# Patient Record
Sex: Male | Born: 1985 | Race: White | Hispanic: No | Marital: Married | Smoking: Former smoker
Health system: Southern US, Community
[De-identification: ages and names within clinical notes are randomized; demographics above are authoritative.]

## PROBLEM LIST (undated history)

## (undated) DIAGNOSIS — F4310 Post-traumatic stress disorder, unspecified: Secondary | ICD-10-CM

## (undated) DIAGNOSIS — E079 Disorder of thyroid, unspecified: Secondary | ICD-10-CM

## (undated) DIAGNOSIS — K219 Gastro-esophageal reflux disease without esophagitis: Secondary | ICD-10-CM

---

## 2009-01-23 ENCOUNTER — Emergency Department (HOSPITAL_BASED_OUTPATIENT_CLINIC_OR_DEPARTMENT_OTHER): Admission: EM | Admit: 2009-01-23 | Discharge: 2009-01-23 | Payer: Self-pay | Admitting: Emergency Medicine

## 2009-03-03 ENCOUNTER — Emergency Department (HOSPITAL_BASED_OUTPATIENT_CLINIC_OR_DEPARTMENT_OTHER): Admission: EM | Admit: 2009-03-03 | Discharge: 2009-03-04 | Payer: Self-pay | Admitting: Emergency Medicine

## 2015-01-10 ENCOUNTER — Emergency Department (HOSPITAL_BASED_OUTPATIENT_CLINIC_OR_DEPARTMENT_OTHER)
Admission: EM | Admit: 2015-01-10 | Discharge: 2015-01-10 | Disposition: A | Discharge status: Home / Self Care | Payer: BLUE CROSS/BLUE SHIELD | Attending: Emergency Medicine | Admitting: Emergency Medicine

## 2015-01-10 ENCOUNTER — Encounter (HOSPITAL_BASED_OUTPATIENT_CLINIC_OR_DEPARTMENT_OTHER): Payer: Self-pay | Admitting: Emergency Medicine

## 2015-01-10 ENCOUNTER — Emergency Department (HOSPITAL_BASED_OUTPATIENT_CLINIC_OR_DEPARTMENT_OTHER): Payer: BLUE CROSS/BLUE SHIELD

## 2015-01-10 DIAGNOSIS — K219 Gastro-esophageal reflux disease without esophagitis: Secondary | ICD-10-CM | POA: Diagnosis not present

## 2015-01-10 DIAGNOSIS — Z87891 Personal history of nicotine dependence: Secondary | ICD-10-CM | POA: Diagnosis not present

## 2015-01-10 DIAGNOSIS — F4310 Post-traumatic stress disorder, unspecified: Secondary | ICD-10-CM | POA: Insufficient documentation

## 2015-01-10 DIAGNOSIS — R079 Chest pain, unspecified: Secondary | ICD-10-CM | POA: Diagnosis not present

## 2015-01-10 DIAGNOSIS — Z79899 Other long term (current) drug therapy: Secondary | ICD-10-CM | POA: Diagnosis not present

## 2015-01-10 DIAGNOSIS — R569 Unspecified convulsions: Secondary | ICD-10-CM | POA: Insufficient documentation

## 2015-01-10 HISTORY — DX: Gastro-esophageal reflux disease without esophagitis: K21.9

## 2015-01-10 HISTORY — DX: Post-traumatic stress disorder, unspecified: F43.10

## 2015-01-10 HISTORY — DX: Disorder of thyroid, unspecified: E07.9

## 2015-01-10 LAB — URINALYSIS, ROUTINE W REFLEX MICROSCOPIC
BILIRUBIN URINE: NEGATIVE
GLUCOSE, UA: NEGATIVE mg/dL
Hgb urine dipstick: NEGATIVE
KETONES UR: NEGATIVE mg/dL
Leukocytes, UA: NEGATIVE
Nitrite: NEGATIVE
PH: 5.5 (ref 5.0–8.0)
Protein, ur: NEGATIVE mg/dL
Specific Gravity, Urine: 1.022 (ref 1.005–1.030)
Urobilinogen, UA: 0.2 mg/dL (ref 0.0–1.0)

## 2015-01-10 LAB — BASIC METABOLIC PANEL
Anion gap: 7 (ref 5–15)
BUN: 22 mg/dL — AB (ref 6–20)
CALCIUM: 9.1 mg/dL (ref 8.9–10.3)
CO2: 24 mmol/L (ref 22–32)
CREATININE: 1 mg/dL (ref 0.61–1.24)
Chloride: 105 mmol/L (ref 101–111)
GFR calc Af Amer: >60 mL/min (ref >60)
Glucose, Bld: 133 mg/dL — ABNORMAL HIGH (ref 65–99)
Potassium: 3.7 mmol/L (ref 3.5–5.1)
SODIUM: 136 mmol/L (ref 135–145)

## 2015-01-10 LAB — CBG MONITORING, ED: GLUCOSE-CAPILLARY: 111 mg/dL — AB (ref 65–99)

## 2015-01-10 LAB — RAPID URINE DRUG SCREEN, HOSP PERFORMED
Amphetamines: NOT DETECTED
BARBITURATES: NOT DETECTED
BENZODIAZEPINES: NOT DETECTED
Cocaine: NOT DETECTED
Opiates: POSITIVE — AB
Tetrahydrocannabinol: NOT DETECTED

## 2015-01-10 LAB — I-STAT CG4 LACTIC ACID, ED: LACTIC ACID, VENOUS: 1.5 mmol/L (ref 0.5–2.0)

## 2015-01-10 LAB — ETHANOL

## 2015-01-10 NOTE — ED Provider Notes (Signed)
CSN: [id number]     Arrival date & time 01/10/15  1237 History   First MD Initiated Contact with Patient 01/10/15 1409     Chief Complaint  Patient presents with  . Seizures     (Consider location/radiation/quality/duration/timing/severity/associated sxs/prior Treatment) HPI   Blood pressure 128/67, pulse 70, temperature 97.8 F (36.6 C), temperature source Oral, resp. rate 17, height  (1.727 m), weight 207 lb (93.895 kg), SpO2 100 %.  Paul [place] is a 29 y.o. male with past medical history significant for PTSD and hypothyroid complaining of possible seizure-like activity last night at 3 AM. Patient states he got up from bed to help his pregnant wife up out of bed to use the bathroom. States that he felt very lightheaded and then there was a brief loss of consciousness for approximately 1 minute. As per wife patient slid back against the wall and down onto the floor, his upper extremities were contracted in he was making gurgling sounds with his mouth, his eyes were closed and he was nonresponsive. There was no incontinence. Patient has no prior history of seizure disorder. Patient was not postictal but states what happened when he woke up and immediately where he was to his wife was. He then proceeded to go directly to bed. She has been in his normal state of health, taking his medications as directed. States his last thyroid check was in March 2016. Patient reports a generalized frontal headache which is typical for his prior headaches, he has chronic left hip pain which is normal for him. Reports that he's been eating and drinking normally but he had 3 episodes of diarrhea yesterday. Patient denies melena, hematochezia, abdominal pain.  Past Medical History  Diagnosis Date  . PTSD (post-traumatic stress disorder)   . GERD (gastroesophageal reflux disease)   . Thyroid disease    History reviewed. No pertinent past surgical history. History reviewed. No pertinent family  history. Social History  Substance Use Topics  . Smoking status: Former Games developer  . Smokeless tobacco: None  . Alcohol Use: No    Review of Systems  10 systems reviewed and found to be negative, except as noted in the HPI.   Allergies  Review of patient's allergies indicates no known allergies.  Home Medications   Prior to Admission medications   Medication Sig Start Date End Date Taking? Authorizing Provider  acetaminophen-codeine (TYLENOL #2) 300-15 MG tablet Take 1 tablet by mouth every 4 (four) hours as needed for moderate pain.   Yes Historical Provider, MD  escitalopram (LEXAPRO) 10 MG tablet Take 10 mg by mouth daily.   Yes Historical Provider, MD  levothyroxine (SYNTHROID, LEVOTHROID) 50 MCG tablet Take 50 mcg by mouth daily before breakfast.   Yes Historical Provider, MD  meloxicam (MOBIC) 15 MG tablet Take 15 mg by mouth daily.   Yes Historical Provider, MD  methocarbamol (ROBAXIN) 750 MG tablet Take 750 mg by mouth 4 (four) times daily.   Yes Historical Provider, MD  pantoprazole (PROTONIX) 40 MG tablet Take 40 mg by mouth daily.   Yes Historical Provider, MD  prazosin (MINIPRESS) 2 MG capsule Take 2 mg by mouth at bedtime.   Yes Historical Provider, MD   BP 128/91 mmHg  Pulse 77  Temp(Src) 97.8 F (36.6 C) (Oral)  Resp 19  Ht  (1.727 m)  Wt 207 lb (93.895 kg)  BMI 31.48 kg/m2  SpO2 99% Physical Exam  Constitutional: He is oriented to person, place, and time. He appears well-developed  and well-nourished.  HENT:  Head: Normocephalic and atraumatic.  Mouth/Throat: Oropharynx is clear and moist.  No conjunctival pallor, mucous membranes are moist.  No intraoral or tongue trauma  Eyes: Conjunctivae and EOM are normal. Pupils are equal, round, and reactive to light.  No TTP of maxillary or frontal sinuses  No TTP or induration of temporal arteries bilaterally  Neck: Normal range of motion. Neck supple.  FROM to C-spine. Pt can touch chin to chest without  discomfort. No TTP of midline cervical spine.   Cardiovascular: Normal rate, regular rhythm and intact distal pulses.   Pulmonary/Chest: Effort normal and breath sounds normal. No respiratory distress. He has no wheezes. He has no rales. He exhibits no tenderness.  Abdominal: Soft. Bowel sounds are normal. There is no tenderness.  Musculoskeletal: Normal range of motion. He exhibits no edema or tenderness.  Neurological: He is alert and oriented to person, place, and time. No cranial nerve deficit.  II-Visual fields grossly intact. III/IV/VI-Extraocular movements intact.  Pupils reactive bilaterally. V/VII-Smile symmetric, equal eyebrow raise,  facial sensation intact VIII- Hearing grossly intact IX/X-Normal gag XI-bilateral shoulder shrug XII-midline tongue extension Motor: 5/5 bilaterally with normal tone and bulk Cerebellar: Normal finger-to-nose  and normal heel-to-shin test.   Romberg negative Ambulates with a coordinated gait   Nursing note and vitals reviewed.   ED Course  Procedures (including critical care time) Labs Review Labs Reviewed  BASIC METABOLIC PANEL - Abnormal; Notable for the following:    Glucose, Bld 133 (*)    BUN 22 (*)    All other components within normal limits  URINE RAPID DRUG SCREEN, HOSP PERFORMED - Abnormal; Notable for the following:    Opiates POSITIVE (*)    All other components within normal limits  CBG MONITORING, ED - Abnormal; Notable for the following:    Glucose-Capillary 111 (*)    All other components within normal limits  URINALYSIS, ROUTINE W REFLEX MICROSCOPIC (NOT AT [facility])  ETHANOL  I-STAT CG4 LACTIC ACID, ED    Imaging Review Ct Head Wo Contrast  01/10/2015  CLINICAL DATA:  Possible seizure last night. Found on the floor making abnormal sounds. EXAM: CT HEAD WITHOUT CONTRAST TECHNIQUE: Contiguous axial images were obtained from the base of the skull through the vertex without intravenous contrast. COMPARISON:  None.  FINDINGS: The brain has a normal appearance without evidence of malformation, atrophy, old or acute infarction, mass lesion, hemorrhage, hydrocephalus or extra-axial collection. The calvarium is unremarkable. The paranasal sinuses, middle ears and mastoids are clear. IMPRESSION: Normal head CT Electronically Signed   By: Paulina Fusi M.D.   On: 01/10/2015 17:45   I have personally reviewed and evaluated these images and lab results as part of my medical decision-making.   EKG Interpretation   Date/Time:  Friday January 10 2015 15:30:57 EST Ventricular Rate:  64 PR Interval:  164 QRS Duration: 90 QT Interval:  406 QTC Calculation: 418 R Axis:   43 Text Interpretation:  Normal sinus rhythm with sinus arrhythmia  Nonspecific T wave abnormality Abnormal ECG Confirmed by RAY MD, DANIELLE  ([id number]) on 01/10/2015 4:53:24 PM      MDM   Final diagnoses:  Seizure-like activity (HCC)    Filed Vitals:   01/10/15 1615 01/10/15 1630 01/10/15 1645 01/10/15 1700  BP: 111/71 125/87 130/82 128/91  Pulse: 70 65 69 77  Temp:      TempSrc:      Resp: Height:  Weight:      SpO2: 100% 99% 99% 99%     Paul Guerrero is 29 y.o. male presenting with seizure like activity last night at 3 AM. Patient had no postictal confusion, no incontinence. This is his first seizure. His neuro exam today is nonfocal. It is unclear if this was a true seizure versus syncope. He did have 3 episodes of non-melanotic, diarrhea yesterday. His abdominal exam is benign. Patient did feel dizzy before he went down yesterday. Blood work today is reassuring. Patient is given seizure precautions. He works as a Event organisercable installer. I've advised him that he will need to be on restrictions with no driving whatsoever, no climbing and he will need to work with a partner.  Blood work reassuring, his BUN slightly elevated consistent with dehydration. UDS is positive for opiates it appears that he is prescribed these for  his chronic left hip pain. EKG with no arrhythmia. Vital signs without orthostatic changes.  CT head negative.  Discussed case with attending physician who agrees with care plan and disposition.   Evaluation does not show pathology that would require ongoing emergent intervention or inpatient treatment. Pt is hemodynamically stable and mentating appropriately. Discussed findings and plan with patient/guardian, who agrees with care plan. All questions answered. Return precautions discussed and outpatient follow up given.       Wynetta Emeryicole Bharath Bernstein, PA-C 01/10/15 1759  Margarita Grizzleanielle Ray, MD 01/10/15 [phone]

## 2015-01-10 NOTE — ED Notes (Signed)
CBG is 111. 

## 2015-01-10 NOTE — ED Notes (Signed)
Pt in c/o possible seizure last night. Pt states he had gotten up and felt dizzy and felt like he fell asleep and was in a "dream state", wife found him on the floor making "snoring sounds", pt came to on the floor and did not remember the event but remembered feeling dizzy just prior to event. Pt with sister that has epilepsy. Pt is a+o and in NAD.

## 2015-01-10 NOTE — Discharge Instructions (Signed)
Do not drive, swim, or perform any activity that could be dangerous if you had a seizure while you are performing it. Do not take showers or baths without having someone close to you.   Follow closely with Neurology, [facility] should call you to set up an appointment shortly, if you do not hear from them in the next 3-4 days please call them  Please follow with your primary care doctor in the next 2 days for a check-up. They must obtain records for further management.   Do not hesitate to return to the Emergency Department for any new, worsening or concerning symptoms.    Seizure, Adult A seizure is abnormal electrical activity in the brain. Seizures usually last from 30 seconds to 2 minutes. There are various types of seizures. Before a seizure, you may have a warning sensation (aura) that a seizure is about to occur. An aura may include the following symptoms:   Fear or anxiety.  Nausea.  Feeling like the room is spinning (vertigo).  Vision changes, such as seeing flashing lights or spots. Common symptoms during a seizure include:  A change in attention or behavior (altered mental status).  Convulsions with rhythmic jerking movements.  Drooling.  Rapid eye movements.  Grunting.  Loss of bladder and bowel control.  Bitter taste in the mouth.  Tongue biting. After a seizure, you may feel confused and sleepy. You may also have an injury resulting from convulsions during the seizure. HOME CARE INSTRUCTIONS   If you are given medicines, take them exactly as prescribed by your health care provider.  Keep all follow-up appointments as directed by your health care provider.  Do not swim or drive or engage in risky activity during which a seizure could cause further injury to you or others until your health care provider says it is OK.  Get adequate rest.  Teach friends and family what to do if you have a seizure. They should:  Lay you on the ground to prevent a  fall.  Put a cushion under your head.  Loosen any tight clothing around your neck.  Turn you on your side. If vomiting occurs, this helps keep your airway clear.  Stay with you until you recover.  Know whether or not you need emergency care. SEEK IMMEDIATE MEDICAL CARE IF:  The seizure lasts longer than 5 minutes.  The seizure is severe or you do not wake up immediately after the seizure.  You have an altered mental status after the seizure.  You are having more frequent or worsening seizures. Someone should drive you to the emergency department or call local emergency services (911 in [place] MAKE SURE YOU:  Understand these instructions.  Will watch your condition.  Will get help right away if you are not doing well or get worse.   This information is not intended to replace advice given to you by your health care provider. Make sure you discuss any questions you have with your health care provider.   Document Released: 02/13/2000 Document Revised: 03/08/2014 Document Reviewed: 09/27/2012 Elsevier Interactive Patient Education [employer]2016 Elsevier Inc.

## 2016-08-17 ENCOUNTER — Encounter (HOSPITAL_BASED_OUTPATIENT_CLINIC_OR_DEPARTMENT_OTHER): Payer: Self-pay | Admitting: Emergency Medicine

## 2016-08-17 ENCOUNTER — Emergency Department (HOSPITAL_BASED_OUTPATIENT_CLINIC_OR_DEPARTMENT_OTHER)
Admission: EM | Admit: 2016-08-17 | Discharge: 2016-08-17 | Disposition: A | Discharge status: Home / Self Care | Payer: BLUE CROSS/BLUE SHIELD | Attending: Physician Assistant | Admitting: Physician Assistant

## 2016-08-17 DIAGNOSIS — Y92015 Private garage of single-family (private) house as the place of occurrence of the external cause: Secondary | ICD-10-CM | POA: Diagnosis not present

## 2016-08-17 DIAGNOSIS — Z87891 Personal history of nicotine dependence: Secondary | ICD-10-CM | POA: Insufficient documentation

## 2016-08-17 DIAGNOSIS — S60561A Insect bite (nonvenomous) of right hand, initial encounter: Secondary | ICD-10-CM | POA: Diagnosis present

## 2016-08-17 DIAGNOSIS — Z79899 Other long term (current) drug therapy: Secondary | ICD-10-CM | POA: Insufficient documentation

## 2016-08-17 DIAGNOSIS — X58XXXA Exposure to other specified factors, initial encounter: Secondary | ICD-10-CM | POA: Insufficient documentation

## 2016-08-17 DIAGNOSIS — Y999 Unspecified external cause status: Secondary | ICD-10-CM | POA: Insufficient documentation

## 2016-08-17 DIAGNOSIS — Y939 Activity, unspecified: Secondary | ICD-10-CM | POA: Diagnosis not present

## 2016-08-17 DIAGNOSIS — Z791 Long term (current) use of non-steroidal anti-inflammatories (NSAID): Secondary | ICD-10-CM | POA: Insufficient documentation

## 2016-08-17 DIAGNOSIS — T63441A Toxic effect of venom of bees, accidental (unintentional), initial encounter: Secondary | ICD-10-CM

## 2016-08-17 DIAGNOSIS — T63451A Toxic effect of venom of hornets, accidental (unintentional), initial encounter: Secondary | ICD-10-CM | POA: Insufficient documentation

## 2016-08-17 MED ORDER — PREDNISONE 10 MG PO TABS: 60.0000 mg | ORAL_TABLET | Freq: Every day | ORAL | 0 refills | Status: AC

## 2016-08-17 NOTE — Discharge Instructions (Signed)
Take steroid pack as prescribed daily for the next 5 days. He may apply ice or heat to the affected area for comfort. He may wrap with an Ace wrap or use a wrist splint for comfort additionally. He may continue to use Benadryl as needed. Follow-up with primary care in the next 3 days for reevaluation. Return to the ED if any concerning signs or symptoms develop.

## 2016-08-17 NOTE — ED Provider Notes (Signed)
[facility] Provider Note   CSN: [id number] Arrival date & time: 08/17/16  1755  By signing my name below, I, Vista Minkobert Ross, attest that this documentation has been prepared under the direction and in the presence of [facility] Center[facility] Dawsyn Zurn PA-C.  Electronically Signed: Vista Minkobert Ross, ED Scribe. 08/17/16. 7:00 PM.   History   Chief Complaint Chief Complaint  Patient presents with  . Insect Bite    HPI HPI Comments: Paul Guerrero is a 31 y.o. male who presents to the Emergency Department complaining of right hand swelling and pain s/p being stung by two insect from the hymenoptera family that occurred approximately around 1:30pm. Pt was stung by two hornets while he was outside in his mothers garage. Pt was stung once to the palmar aspect of his right index finger and once to his right thumb. He had immediate pain to his entire right hand s/p. Shortly after, his right hand started to swell and has been unrelieved by 50mg  Benadryl prior to arrival; he was able to swallow the pill without difficulty. Currently, he does not have any significant pain while resting the hand but notes an exacerbation of sharp pain during movement and palpation of the hand. He notes difficulty making a fist and reduced dexterity as a result of the swelling. Pt's wife states that the pt was bit by fire ants in the past and had a similar reaction which required treatment with steroids. No lip swelling, tongue swelling, shortness of breath, numbness or tingling. He is up-to-date on his tetanus per patient.  The history is provided by the patient. No language interpreter was used.    Past Medical History:  Diagnosis Date  . GERD (gastroesophageal reflux disease)   . PTSD (post-traumatic stress disorder)   . Thyroid disease     There are no active problems to display for this patient.   History reviewed. No pertinent surgical history.     Home Medications    Prior to Admission medications   Medication Sig  Start Date End Date Taking? Authorizing Provider  pantoprazole (PROTONIX) 40 MG tablet Take 40 mg by mouth daily.   Yes [provider]  sertraline (ZOLOFT) 100 MG tablet Take 100 mg by mouth daily.   Yes [provider]  acetaminophen-codeine (TYLENOL #2) 300-15 MG tablet Take 1 tablet by mouth every 4 (four) hours as needed for moderate pain.    [provider]  escitalopram (LEXAPRO) 10 MG tablet Take 10 mg by mouth daily.    [provider]  levothyroxine (SYNTHROID, LEVOTHROID) 50 MCG tablet Take 50 mcg by mouth daily before breakfast.    [provider]  meloxicam (MOBIC) 15 MG tablet Take 15 mg by mouth daily.    [provider]  methocarbamol (ROBAXIN) 750 MG tablet Take 750 mg by mouth 4 (four) times daily.    [provider]  prazosin (MINIPRESS) 2 MG capsule Take 2 mg by mouth at bedtime.    [provider]  predniSONE (DELTASONE) 10 MG tablet Take 6 tablets (60 mg total) by mouth daily with breakfast. 08/17/16 08/22/16  Jeanie SewerFawze, Tamy Accardo A, PA-C    Family History No family history on file.  Social History Social History  Substance Use Topics  . Smoking status: Former Games developermoker  . Smokeless tobacco: Never Used  . Alcohol use No     Allergies   Patient has no known allergies.   Review of Systems Review of Systems  HENT: Negative for facial swelling and trouble  swallowing.   Respiratory: Negative for shortness of breath and wheezing.   Skin: Positive for wound (two hornet stings to right index and right thumb).  Neurological: Negative for numbness.  All other systems reviewed and are negative.    Physical Exam Updated Vital Signs BP 129/81 (BP Location: Left Arm)   Pulse 82   Temp 98.2 F (36.8 C) (Oral)   Resp 18   Ht 5\' 7"  (1.702 m)   Wt 108.9 kg (240 lb)   SpO2 98%   BMI 37.59 kg/m   Physical Exam  Constitutional: He is oriented to person, place, and time. He appears well-developed and  well-nourished. No distress.  HENT:  Head: Normocephalic and atraumatic.  No swelling of the lips or tongue, airways patent, no drooling  Eyes: Conjunctivae are normal. Right eye exhibits no discharge. Left eye exhibits no discharge.  Neck: Normal range of motion. No JVD present. No tracheal deviation present.  Cardiovascular: Normal rate and regular rhythm.   Pulmonary/Chest: Effort normal and breath sounds normal.  Abdominal: He exhibits no distension.  Musculoskeletal: He exhibits edema.  Right hand with generalized swelling from the wrist to the fingertips. Decreased range of motion of the right wrist and digits on flexion and extension due to swelling. Generalized tenderness to palpation of the right hand and digits. 4/5 strength of the wrist and grip strength due to swelling. 5/5 strength of digits with flexion and extension against resistance. No observable stingers, blisters, bleeding, or drainage. No erythema, warmth, crepitus, or deformity noted.  Neurological: He is alert and oriented to person, place, and time.  Fluent speech, no facial droop, sensation intact to soft touch of bilateral hands  Skin: Skin is warm and dry. Capillary refill takes less than 2 seconds. He is not diaphoretic.  No mottling of the skin, urticaria, or other rash  Psychiatric: He has a normal mood and affect. Judgment normal.  Nursing note and vitals reviewed.    ED Treatments / Results  DIAGNOSTIC STUDIES: Oxygen Saturation is 98% on RA, normal by my interpretation.  COORDINATION OF CARE: 7:01 PM-Discussed treatment plan with pt at bedside and pt agreed to plan.   Labs (all labs ordered are listed, but only abnormal results are displayed) Labs Reviewed - No data to display  EKG  EKG Interpretation None       Radiology No results found.  Procedures Procedures (including critical care time)  Medications Ordered in ED Medications - No data to display   Initial Impression / Assessment  and Plan / ED Course  I have reviewed the triage vital signs and the nursing notes.  Pertinent labs & imaging results that were available during my care of the patient were reviewed by me and considered in my medical decision making (see chart for details).     Patient with acute onset of right hand swelling secondary to insect sting, likely hornet. Afebrile, vital signs are stable. No rashes, angioedema, drooling, or other signs of anaphylactic reaction. Airway is patent. Swelling is atraumatic, x-rays not indicated at this time. No visible stingers embedded into the skin. No signs of superimposed cellulitis. Low suspicion of gout, septic arthritis, osteomyelitis. Edema likely reaction to the stings. Will discharge home with steroid pulse, recommend continued use of Benadryl as needed. Discussed indications for return to the ED. He'll follow up with primary care for reevaluation. Pt verbalized understanding of and agreement with plan and is safe for discharge home at this time.   Final Clinical Impressions(s) /  ED Diagnoses   Final diagnoses:  Bee sting, accidental or unintentional, initial encounter    New Prescriptions Discharge Medication List as of 08/17/2016  7:12 PM    START taking these medications   Details  predniSONE (DELTASONE) 10 MG tablet Take 6 tablets (60 mg total) by mouth daily with breakfast., Starting Tue 08/17/2016, Until Sun 08/22/2016, Print      I personally performed the services described in this documentation, which was scribed in my presence. The recorded information has been reviewed and is accurate.    Jeanie Sewer, PA-C 08/17/16 2001    Abelino Derrick, MD 08/17/16 [id number]

## 2016-08-17 NOTE — ED Triage Notes (Signed)
2 bee stings to R hand noted, swelling noted. Denies SOB. Pt took benadryl PTA

## 2016-09-01 IMAGING — CT CT HEAD W/O CM
2 series · 16 of 30 positions shown, 18 images · non-contrast
Comparison: None.

CLINICAL DATA: Possible seizure last night. Found on the floor
making abnormal sounds.

EXAM:
CT HEAD WITHOUT CONTRAST
TECHNIQUE: Contiguous axial images were obtained from the base of the skull
through the vertex without intravenous contrast.

[Series 2: head 4.8 h37s · axial · 0.45mm/px · z∈[+801,+937]mm · 8 of 36 slices shown, 10 images]
[im 4/36  brain]
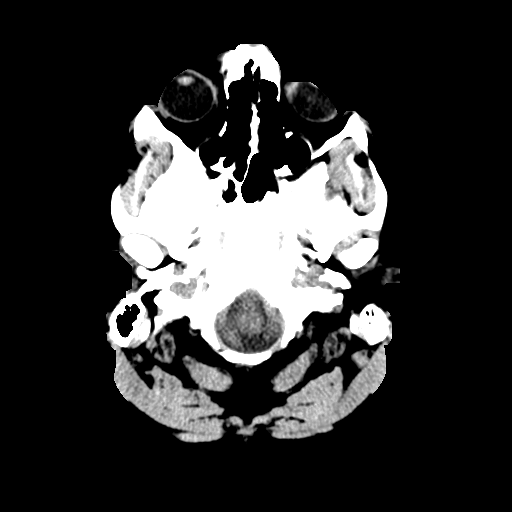
[im 4/36  bone]
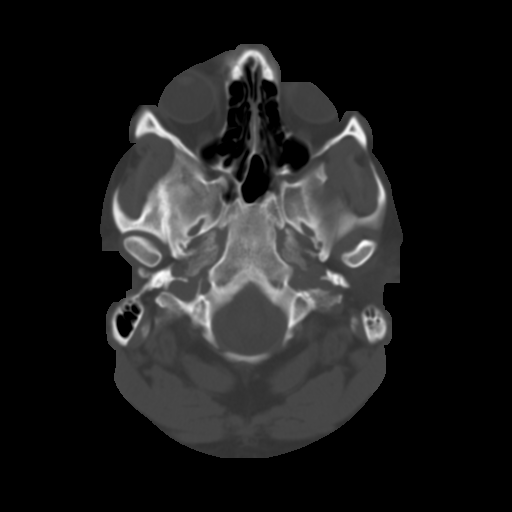
[im 8/36  brain]
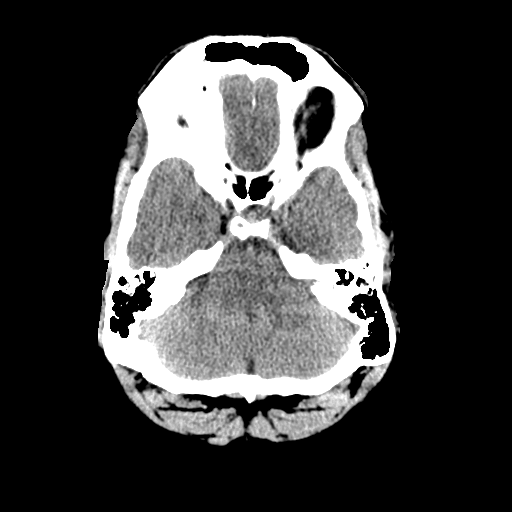
[im 12/36  brain]
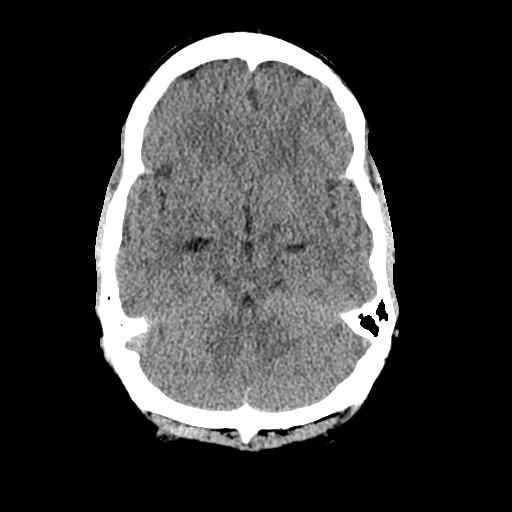
[im 16/36  brain]
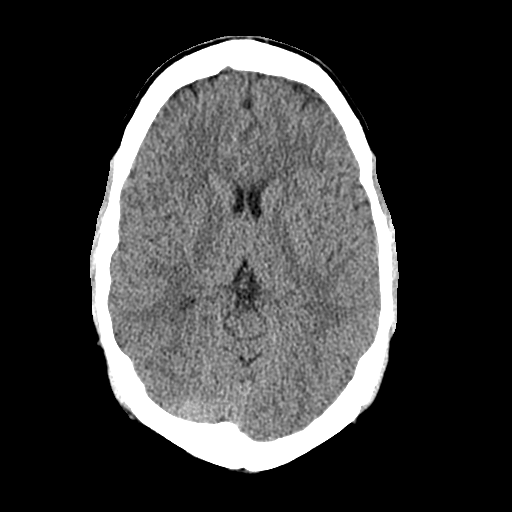
[im 20/36  brain]
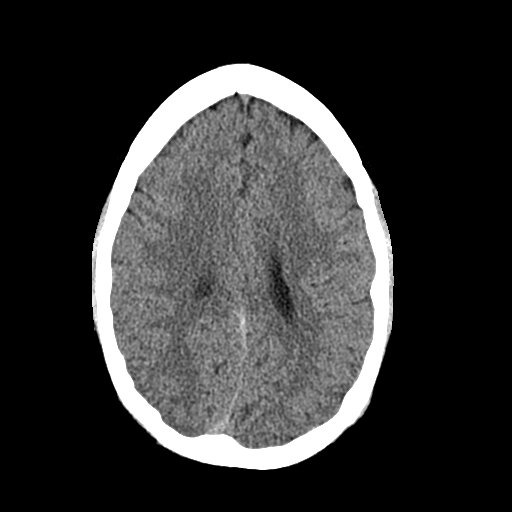
[im 20/36  bone]
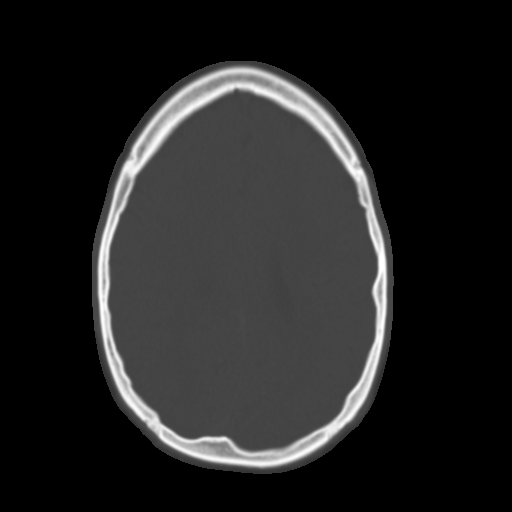
[im 24/36  brain]
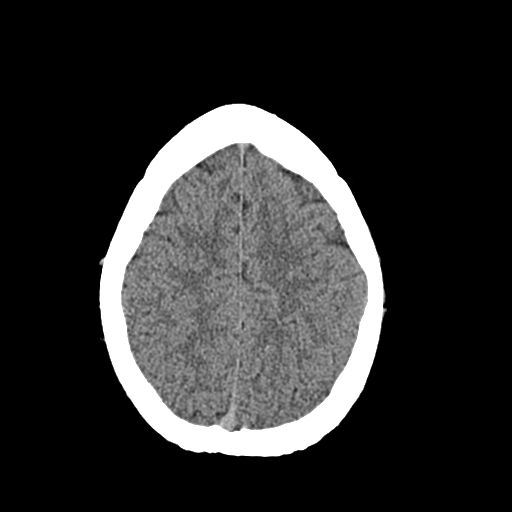
[im 28/36  brain]
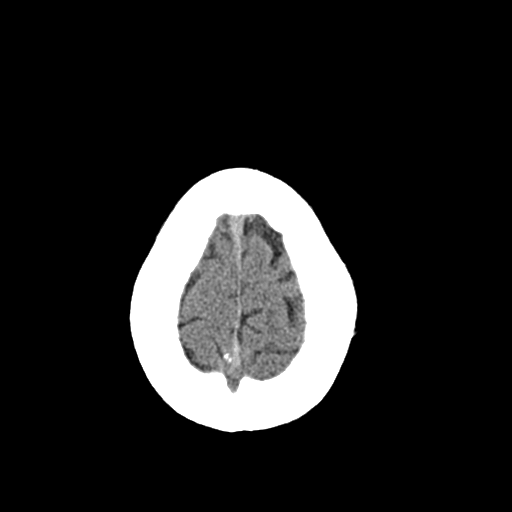
[im 32/36  brain]
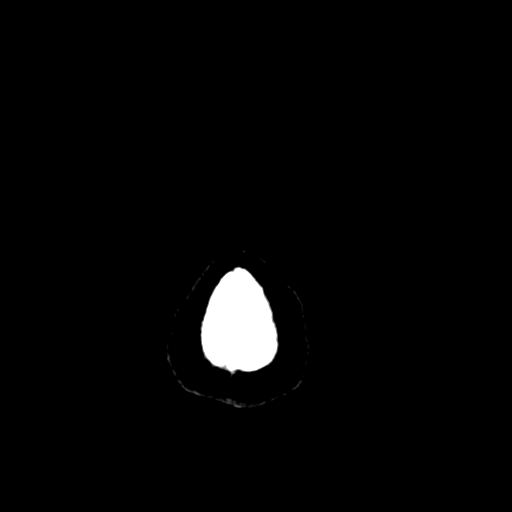

[Series 3: head 2.4 h60s bone · axial · 0.45mm/px · z∈[+802,+938]mm · 8 of 72 slices shown]
[im 8/72  bone]
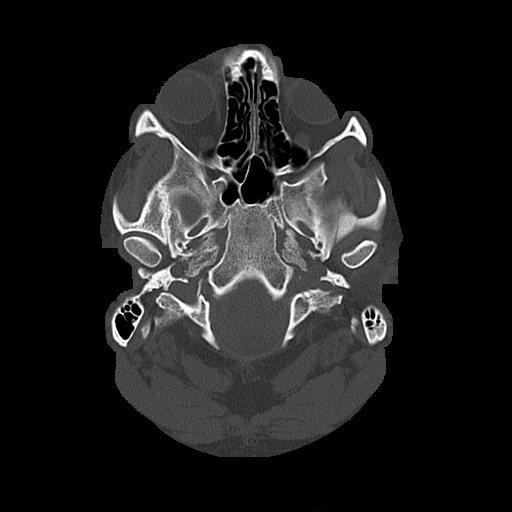
[im 15/72  bone]
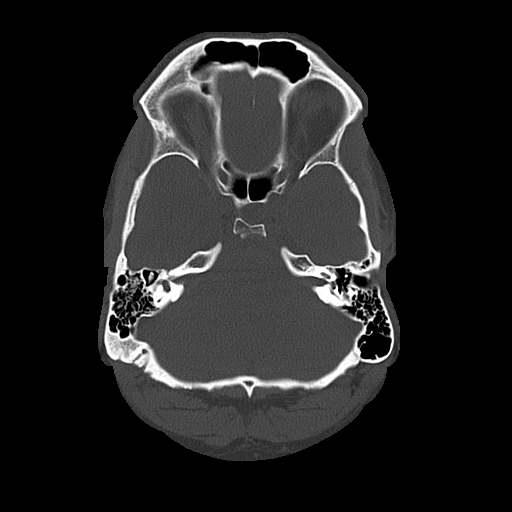
[im 23/72  bone]
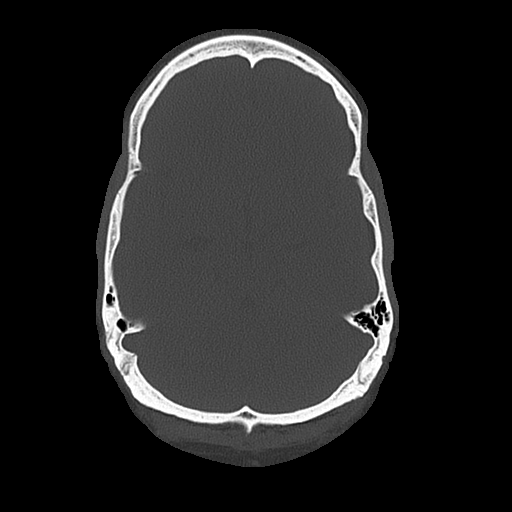
[im 30/72  bone]
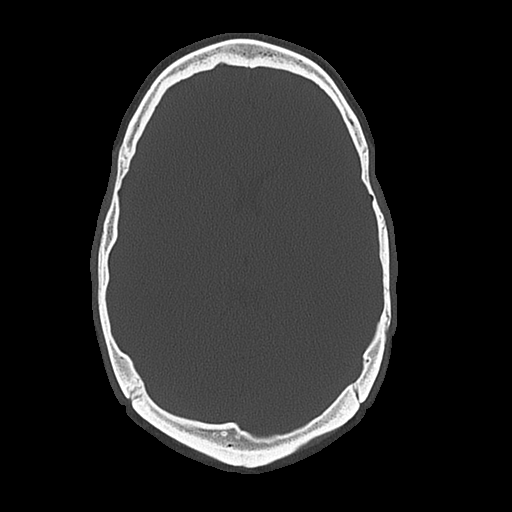
[im 42/72  bone]
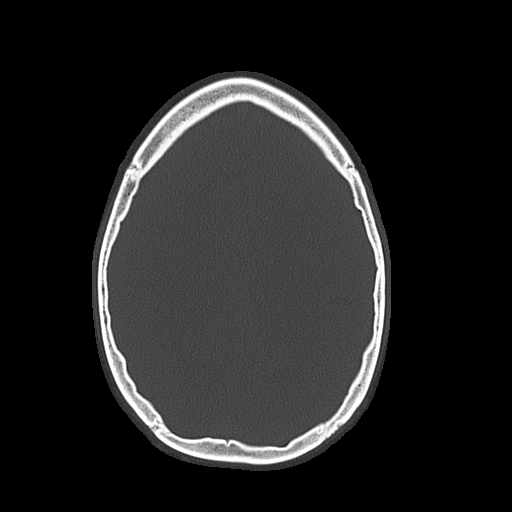
[im 49/72  bone]
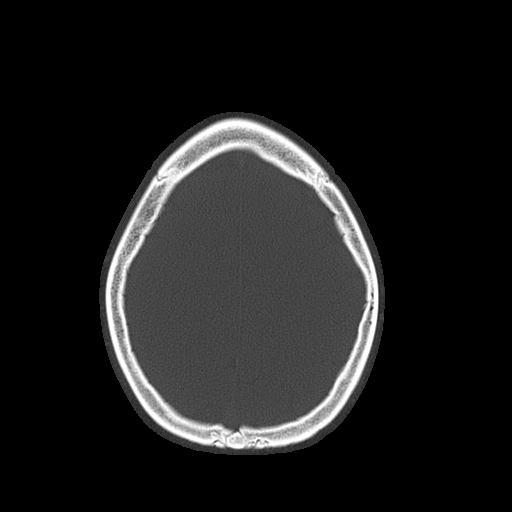
[im 57/72  bone]
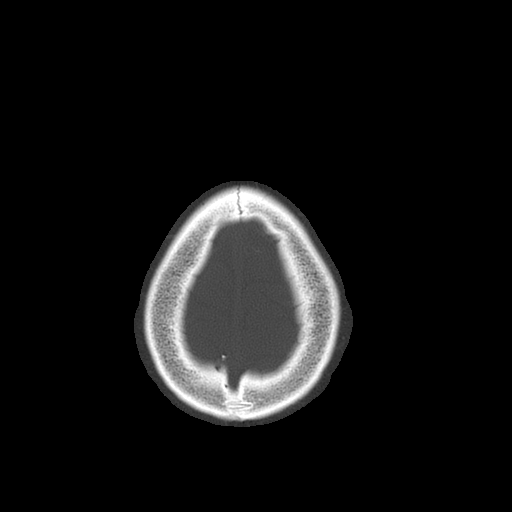
[im 64/72  bone]
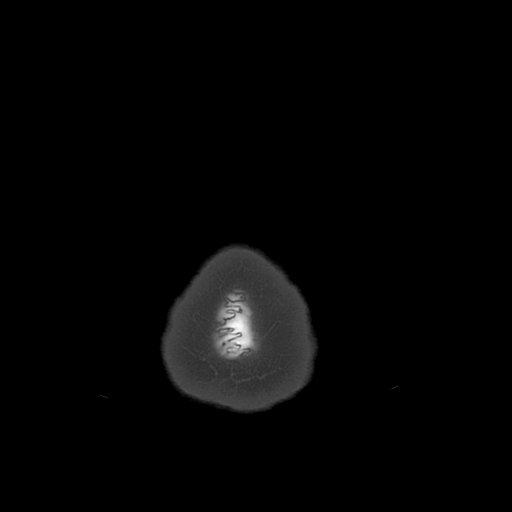

[16 of 30 positions shown; findings below may reference images not displayed]

FINDINGS: The brain has a normal appearance without evidence of malformation,
atrophy, old or acute infarction, mass lesion, hemorrhage,
hydrocephalus or extra-axial collection. The calvarium is
unremarkable. The paranasal sinuses, middle ears and mastoids are
clear.
IMPRESSION: Normal head CT
